# Patient Record
Sex: Female | Born: 1970 | Race: Black or African American | Hispanic: No | Marital: Married
Health system: Southern US, Community
[De-identification: ages and names within clinical notes are randomized; demographics above are authoritative.]

---

## 2010-10-20 ENCOUNTER — Emergency Department: Payer: Self-pay | Admitting: Emergency Medicine

## 2011-07-23 ENCOUNTER — Ambulatory Visit: Payer: Self-pay | Admitting: Internal Medicine

## 2013-02-13 ENCOUNTER — Inpatient Hospital Stay: Payer: Self-pay | Admitting: Internal Medicine

## 2013-02-13 LAB — COMPREHENSIVE METABOLIC PANEL
Albumin: 3.3 g/dL — ABNORMAL LOW (ref 3.4–5.0)
Alkaline Phosphatase: 54 U/L
Bilirubin,Total: 0.7 mg/dL (ref 0.2–1.0)
Calcium, Total: 8.1 mg/dL — ABNORMAL LOW (ref 8.5–10.1)
Co2: 24 mmol/L (ref 21–32)
Creatinine: 0.8 mg/dL (ref 0.60–1.30)
EGFR (African American): >60
EGFR (Non-African Amer.): >60
SGOT(AST): 19 U/L (ref 15–37)
Total Protein: 7.2 g/dL (ref 6.4–8.2)

## 2013-02-13 LAB — URINALYSIS, COMPLETE
Bacteria: NONE SEEN
Bilirubin,UR: NEGATIVE
Glucose,UR: NEGATIVE mg/dL (ref 0–75)
Ketone: NEGATIVE
Leukocyte Esterase: NEGATIVE
Ph: 5 (ref 4.5–8.0)
RBC,UR: 1 /HPF (ref 0–5)
WBC UR: 4 /HPF (ref 0–5)

## 2013-02-13 LAB — LIPASE, BLOOD: Lipase: 57 U/L — ABNORMAL LOW (ref 73–393)

## 2013-02-13 LAB — CBC
MCV: 85 fL (ref 80–100)
Platelet: 235 10*3/uL (ref 150–440)
RBC: 4.56 10*6/uL (ref 3.80–5.20)
RDW: 13.5 % (ref 11.5–14.5)

## 2013-02-13 LAB — PREGNANCY, URINE: Pregnancy Test, Urine: NEGATIVE m[IU]/mL

## 2013-02-14 LAB — CBC WITH DIFFERENTIAL/PLATELET
Basophil #: 0 10*3/uL (ref 0.0–0.1)
Basophil %: 0.1 %
Eosinophil #: 0 10*3/uL (ref 0.0–0.7)
Eosinophil %: 0 %
HCT: 36.7 % (ref 35.0–47.0)
HGB: 12.4 g/dL (ref 12.0–16.0)
Lymphocyte #: 0.7 10*3/uL — ABNORMAL LOW (ref 1.0–3.6)
Lymphocyte %: 6.7 %
MCH: 28.5 pg (ref 26.0–34.0)
Monocyte %: 1.6 %
Neutrophil #: 9.9 10*3/uL — ABNORMAL HIGH (ref 1.4–6.5)
Platelet: 213 10*3/uL (ref 150–440)
RBC: 4.35 10*6/uL (ref 3.80–5.20)
RDW: 13.3 % (ref 11.5–14.5)
WBC: 10.8 10*3/uL (ref 3.6–11.0)

## 2013-02-14 LAB — BASIC METABOLIC PANEL
BUN: 7 mg/dL (ref 7–18)
Calcium, Total: 8 mg/dL — ABNORMAL LOW (ref 8.5–10.1)
Chloride: 106 mmol/L (ref 98–107)
Creatinine: 0.64 mg/dL (ref 0.60–1.30)
EGFR (Non-African Amer.): >60
Glucose: 118 mg/dL — ABNORMAL HIGH (ref 65–99)
Potassium: 4 mmol/L (ref 3.5–5.1)

## 2013-02-16 LAB — STOOL CULTURE

## 2013-05-03 ENCOUNTER — Emergency Department: Payer: Self-pay | Admitting: Emergency Medicine

## 2014-05-21 ENCOUNTER — Emergency Department: Payer: Self-pay | Admitting: Emergency Medicine

## 2014-07-05 NOTE — Consult Note (Signed)
PATIENT NAME:  Stacy Oneal, [name] MR#:  [id number] DATE OF BIRTH:  May 26, 1970  DATE OF CONSULTATION:  02/13/2013  REFERRING PHYSICIAN:   CONSULTING PHYSICIAN:  Adah Salvageichard E. Excell Seltzerooper, MD  CHIEF COMPLAINT: Epigastric pain.   HISTORY OF PRESENT ILLNESS: This is a patient with a history of fall, which was preceded by back pain and abdominal pain that started on Thursday or Friday. It has been gradually worsening. She cannot point to any area in particular except the epigastrium, but states that it is all over her abdomen. She has never had an episode like this before. She has had fevers as high as 102 at home. She has had some diarrhea and vomited multiple times. She does not localize her pain to the right side at all.   PAST MEDICAL HISTORY: Adrenal insufficiency and multiple sclerosis.   PAST SURGICAL HISTORY: Tonsillectomy, adenoidectomy and hemorrhoidectomy.   ALLERGIES: None.   MEDICATIONS: Multiple, see chart.   FAMILY HISTORY: Noncontributory.   SOCIAL HISTORY: The patient does not smoke or drink.   REVIEW OF SYSTEMS: 10 system review is performed and negative with the exception of that mentioned in the HPI.   PHYSICAL EXAMINATION:  GENERAL: Healthy female patient, comfortable-appearing.  VITAL SIGNS: Temperature of 98.2, pulse 99, respirations 18, blood pressure 103/71, 99% room air sat.  HEENT: No scleral icterus.  NECK: No palpable neck nodes.  CHEST: Clear to auscultation.  CARDIAC: Regular rate and rhythm.  ABDOMEN: Soft, nondistended, non-tympanitic, minimally diffuse tenderness more so in the upper quadrants than the lower quadrants. No guarding, no rebound and no percussion tenderness.  EXTREMITIES: Without edema. Calves are nontender.  NEUROLOGIC: Grossly intact.  INTEGUMENT: No jaundice.   CT scan is personally reviewed. The appendix is identified and dilated, but there is no periappendiceal stranding or fluid. There is some fluid in the pelvis. Electrolytes  demonstrate a low potassium of 3.1, low calcium at 8.1, otherwise normal and a lipase of 57. White blood cell count is 9.0, hemoglobin and hematocrit of 12 and 39, platelet count 235. HCG is negative.   ASSESSMENT AND PLAN: This is a patient with epigastric pain, diffuse abdominal pain and tenderness, which is very minimal in nature, without peritoneal signs and I was asked to see the patient because the CT scan showed a dilated appendix. There is no evidence of appendicitis in this patient, With her nausea, vomiting, diarrhea and epigastric pain, I would suggest obtaining a ultrasound to rule out gallstones as this is certainly a possibility. It is not well identified on the CT scan, I will order the ultrasound and follow the patient while she is in the hospital,  ____________________________ Adah Salvageichard E. Excell Seltzerooper, MD rec:aw D: 02/13/2013 15:44:37 ET T: 02/13/2013 15:52:12 ET JOB#: [id number]  cc: Adah Salvageichard E. Excell Seltzerooper, MD, <Dictator> Lattie HawICHARD E Elanna Bert MD ELECTRONICALLY SIGNED 02/14/2013 18:07

## 2014-07-05 NOTE — H&P (Signed)
PATIENT NAME:  Stacy Oneal, Stacy D MR#:  [id number] DATE OF BIRTH:  1970-05-09  DATE OF ADMISSION:  02/13/2013  PRIMARY CARE PROVIDER: None local.   EMERGENCY DEPARTMENT REFERRING PHYSICIAN: Dr. Burman RiisWoodrow.   CHIEF COMPLAINT: Nausea, vomiting, diarrhea, abdominal pain.   HISTORY OF PRESENT ILLNESS: The patient is a 44 year old African American female with history of adrenal insufficiency, multiple sclerosis, who has chronic weakness in her leg but is still able to ambulate. On Thanksgiving, the patient was walking outside when she almost fell and caught herself on her leg, but at that time she twisted her back, and since then she is having significant amount of constant, sharp back pain with pain going down her legs with some weakness in the legs. Along with that, she started having nausea, vomiting and diarrhea and has felt very weak. She also has felt feverish at home, therefore, came to the ED. She was noted to have a low blood pressure, 99/56, and her heart rate was a little elevated. The patient, otherwise, also complains of abdominal pain in the epigastric area, but has not had any hematemesis or hematochezia. Denies any urinary frequency, urgency or hesitancy. Reports that she has been taking her medications as prescribed. For her adrenal insufficiency, her primary care provider did call in atropine and diphenoxylate for diarrhea, as well as Zofran and Percocet.   PAST MEDICAL HISTORY: Significant for adrenal insufficiency, multiple sclerosis for 10 years.   ALLERGIES: None.   CURRENT MEDICATIONS: Atropine, diphenoxylate 1 tab 4 times a day as needed for diarrhea, hydrocortisone 10 mg at bedtime, hydrocortisone 5 mg 3 tabs daily, Zofran 4 mg 1 tab p.o. t.i.d. as needed for nausea, vomiting; Percocet 5/325 mg 1 tab p.o. q.6 p.r.n.   SOCIAL HISTORY: Does not smoke. Does not drink. No drugs.   FAMILY HISTORY: Father with coronary artery disease, diabetes.     REVIEW OF SYSTEMS:    CONSTITUTIONAL: Complains of fevers, fatigue, weakness, back pain. No weight loss. No weight gain.  EYES: No blurred or double vision. No pain. No redness. No inflammation. No glaucoma. No cataracts.  EARS, NOSE, THROAT: No tinnitus. No ear pain. No hearing loss. No seasonal or year-round allergies. No epistaxis. No postnasal drip. No difficulty swallowing.  RESPIRATORY: Denies any cough, wheezing, hemoptysis. No COPD.  No TB.  CARDIOVASCULAR: Denies any chest pain, orthopnea, edema or arrhythmia.  GASTROINTESTINAL: Complains of nausea, vomiting, diarrhea. Complains of abdominal pain. No hematemesis. No melena. No guarding. No IBS. No jaundice. No rectal bleeding. Complains of diarrhea.    GENITOURINARY: Denies any dysuria, hematuria, renal calc or frequency.  ENDOCRINE: Denies any polyuria, nocturia or thyroid problems.  HEMATOLOGIC AND LYMPHATIC: Denies anemia, easy bruisability or bleeding.  SKIN: No acne. No rash. No changes in hair or moles.  MUSCULOSKELETAL: Complains of significant back pain.  NEUROLOGIC: Denies any numbness. Complains of weakness in both lower extremities. Complains of difficulty with ambulation due to pain. No CVA. No TIA.  Has multiple sclerosis. No seizures.  PSYCHIATRIC: No anxiety. No insomnia. No ADD.   PHYSICAL EXAMINATION: VITAL SIGNS: Temperature 99.7, pulse 100, respirations 18, blood pressure 99/56.  GENERAL: The patient is a well-developed [place] female in no acute distress.  HEAD, EYES, EARS, NOSE, THROAT: Head atraumatic, normocephalic. Pupils equally round and reactive to light and accommodation. There is no conjunctival pallor. No scleral icterus. Nasal exam shows no drainage or ulceration. Oropharynx is clear without any exudate.  NECK: Supple without any JVD.  CARDIOVASCULAR: Regular rate and  rhythm. No murmurs, rubs, clicks or gallops. PMI is not displaced.  ABDOMEN: Soft, nondistended. Positive bowel sounds x 4. She does have some mild  epigastric tenderness without any guarding. No rebound.  EXTREMITIES: No clubbing, cyanosis or edema.  SKIN: No rash.  LYMPHATICS: No lymph nodes palpable.  VASCULAR: Good DP, PT pulses.  MUSCULOSKELETAL: Has lumbar spine tenderness.  NEUROLOGICAL: She has bilateral lower extremity weakness. Awake, alert, oriented x 3. No focal deficits.  PSYCHIATRIC: Not anxious or depressed.   LABORATORY EVALUATION:  CT of the abdomen shows appendix is fluid-filled and mildly distended measuring 8.1 mm without periappendiceal inflammatory changes. Her glucose is 79. BUN 10, creatinine 0.80, sodium 137, potassium 3.1, chloride 106. CO2 is 24. Lipase 57. LFTs showed albumin of 3.3. WBC 9.0, hemoglobin 12.6, platelet count 253. Urinalysis is negative.   ASSESSMENT AND PLAN: The patient is a 44 year old African American female with a history of renal insufficiency, multiple sclerosis, has been having significant back pain, difficulty with ambulation.  1.  Nausea, vomiting, diarrhea, possibly due to adrenal crisis. At this time, we will treat her with IV dexamethasone. Her appendix is dilated, but there is no inflammation. However, in light of her abdominal pain, I will ask surgery to evaluate. We will provide her with pain control. Also, in light of diarrhea, we will send stools for C. diff and stool cultures. If persist, will need GI evaluation.  2.  Back pain after a near fall. CT scan shows broad-based disk bulging. At this time, we will get an MRI of the spine. Pain control for the time being.  3.  Low-grade temperature. We will get blood cultures. Urine is negative. We will treat her with empiric antibiotics with Levaquin in light of her adrenal insufficiency.  4.  Miscellaneous: I will place her on Lovenox for DVT prophylaxis.    NOTE: 55 minutes spent on this patient.      ____________________________ Lacie Scotts. Allena Katz, MD shp:dmm D: 02/13/2013 13:20:37 ET T: 02/13/2013 13:32:46  ET JOB#: [id number]  cc: Gitty Osterlund H. Allena Katz, MD, <Dictator> Charise Carwin MD ELECTRONICALLY SIGNED 02/23/2013 12:45

## 2014-07-05 NOTE — Consult Note (Signed)
Brief Consult Note: Diagnosis: epigastric pain.   Patient was seen by consultant.   Consult note dictated.   Recommend further assessment or treatment.   Orders entered.   Comments: Four days epig pain, diffuse abd [pain, no localization to RLQ. No peritoneal signs, nonlocalized. Nml WBC. CT personally rev'd.No sign of appendicitis. Will follow.  Electronic Signatures: Lattie Hawooper, Mendel Binsfeld E (MD)  (Signed 02-Dec-14 15:38)  Authored: Brief Consult Note   Last Updated: 02-Dec-14 15:38 by Lattie Hawooper, Layloni Fahrner E (MD)

## 2014-07-05 NOTE — Discharge Summary (Signed)
PATIENT NAME:  Stacy Oneal, Stacy Oneal MR#:  [id number] DATE OF BIRTH:  Jul 19, 1970  DATE OF ADMISSION:  02/13/2013  DATE OF DISCHARGE: 02/15/2013  DISCHARGE DIAGNOSES:  1.  Acute gastroenteritis. 2.  Adrenal insufficiency. 3.  Fall and back pain, muscle spasms.   CONDITION ON DISCHARGE: Stable.   CODE STATUS: FULL CODE.   MEDICATIONS ON DISCHARGE: 1.  Hydrocortisone 5 mg oral tablet 3 tablets once a day.  2.  Hydrocortisone 10 mg oral tablet 1 tablet once a day at bedtime.  3.  Ondansetron 4 mg oral tablet 3 times a day as needed for nausea.  4.  Atropine and diphenoxylate 1 tablet 4 times a day as needed for diarrhea.  5.  Percocet 1 tablet every 6 hours as needed for pain.  6.  Cyclobenzaprine 10 mg oral tablet every 8 hours as needed for muscle spasms.   INSTRUCTIONS:  Diet on discharge: Regular. Diet consistency: Regular. Time frame to follow up:  Within 2 to 4 weeks. Advised to have routine follow ups with primary care physician.   HISTORY OF PRESENTING ILLNESS: A 44 year old African-American female with history of adrenal insufficiency, multiple sclerosis, with chronic weakness in her leg, but is still able to ambulate. On Thanksgiving, the patient was walking outside when she almost fell down and caught herself on her leg, but at that time she twisted her back. Since then, she is having significant amount of constant, sharp back pain, with pain going down her legs, with some weakness in the legs. Along with that, she started having nausea, vomiting and diarrhea, and has felt very weak. She also has felt feverish at home; therefore, came to Emergency Room. She was noted to have a low blood pressure of 99/56, and her heart rate was a little elevated. The patient otherwise also complains of abdominal pain in epigastric area, but did not have any blood in the stool or vomit, so she was admitted for further management.   HOSPITAL COURSE AND STAY:    1.  For nausea, vomiting and diarrhea,  most likely it was gastroenteritis. All the workups were negative, including stool studies. She was on empiric antibiotics, and started feeling better with symptomatic management, so we discharged her home.   2.  Back pain after a fall. CT scan was done, which showed broad-spaced disk bulging. MRI does not show any acute finding. Muscle relaxants and pain control were ordered. Consult was done with Orthopedic, and did not suggest anything further.  3.  Low-grade temperature. Blood cultures were negative. Urine and x-ray was negative, so possibly it was due to gastroenteritis.   4.  Adrenal insufficiency. This was a chronic and stable issue. The patient was on chronic steroid replacement therapy. We continued the same in the hospital.   IMPORTANT LAB RESULTS IN THE HOSPITAL: Urinalysis was negative. White cell count was 9000 on presentation, hemoglobin 12.6, lipase 57, creatinine 0.8. Pregnancy test in the urine was negative. CT abdomen and pelvis with contrast showed appendix is fluid-filled and mildly distended, 8.1 mm. Surgery consult was done for this, and they suggested no surgical management at this time. MRI lumbar spine did not show any acute finding. C. diff in the stool was negative. Stool culture did not grow any organisms. Ultrasound abdomen, general survey:  No evidence of gallstone or acute cholecystitis. Liver, spleen, visualized portion of, no acute abnormality in kidney, no ascites.    Total time spent on this discharge: 40 minutes.    ____________________________ Hope Pigeon  Elisabeth PigeonVachhani, MD vgv:mr D: 02/17/2013 16:12:00 ET T: 02/17/2013 20:35:41 ET JOB#: [id number]  cc: Hope PigeonVaibhavkumar G. Elisabeth PigeonVachhani, MD, <Dictator> Altamese DillingVAIBHAVKUMAR Dorette Hartel MD ELECTRONICALLY SIGNED 02/20/2013 9:19

## 2014-07-21 IMAGING — US ABDOMEN ULTRASOUND
1 series · 13 of 25 positions shown · non-contrast
Comparison: None.

CLINICAL DATA: Epigastric pain and symptoms suspicious of
cholecystitis

EXAM:
ULTRASOUND ABDOMEN COMPLETE

[Series 1: abdomen ultrasound · 0.17mm/px · 13 of 66 slices shown]
[im 1/66]
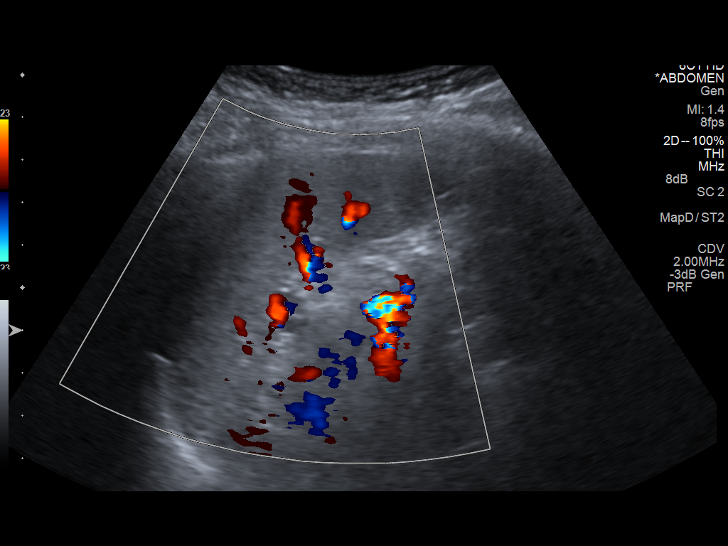
[im 6/66]
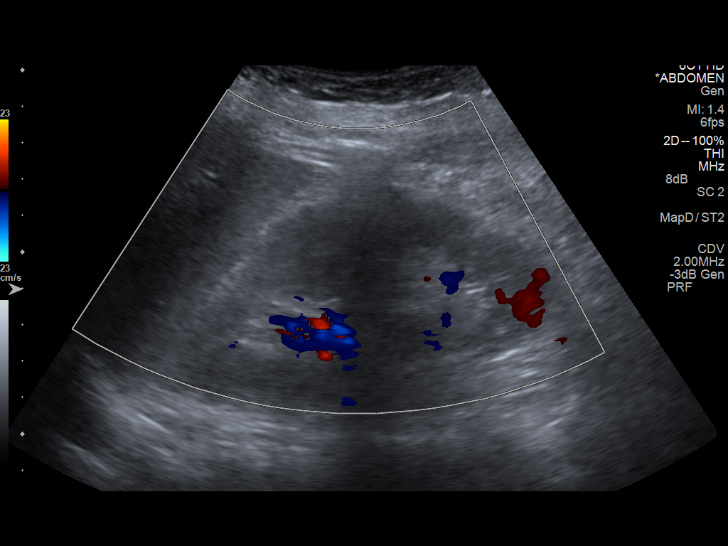
[im 11/66]
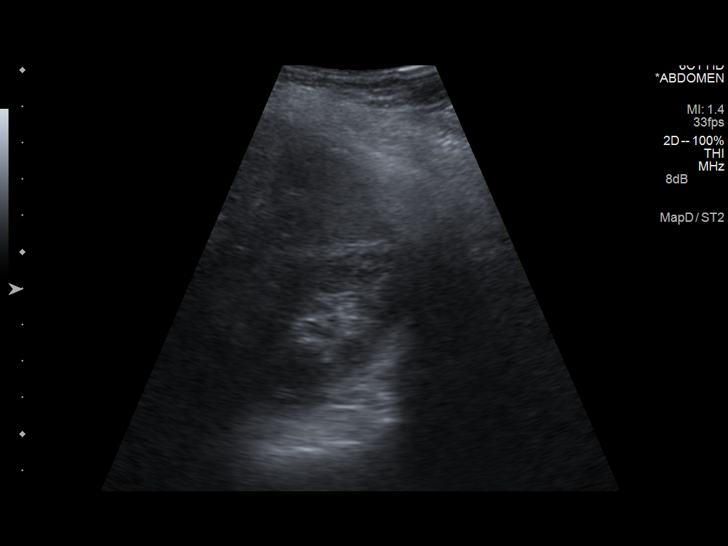
[im 17/66]
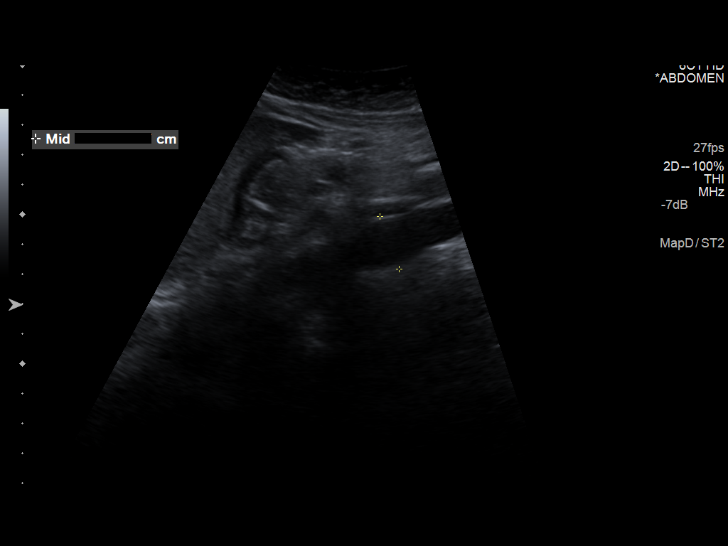
[im 22/66]
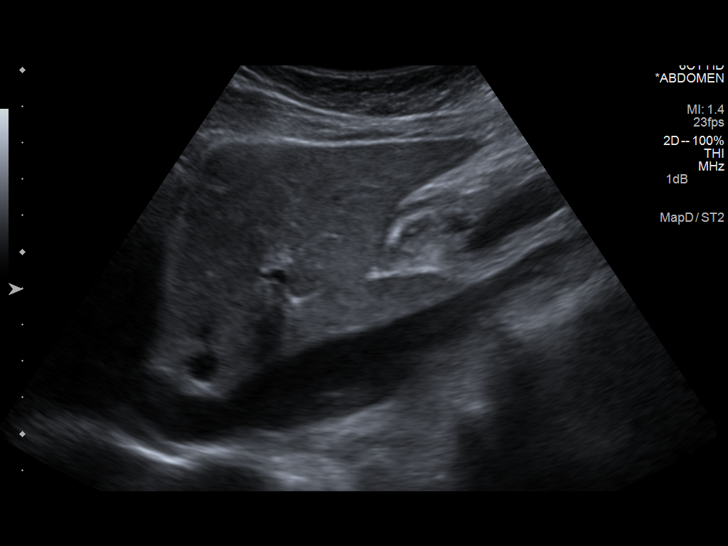
[im 28/66]
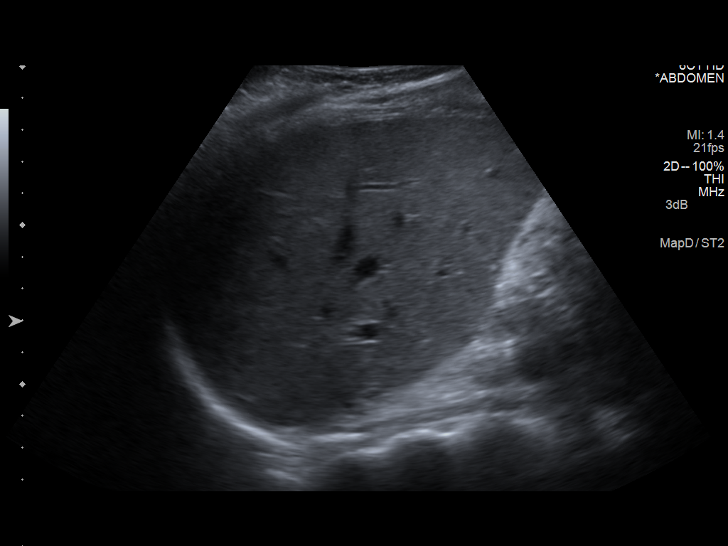
[im 33/66]
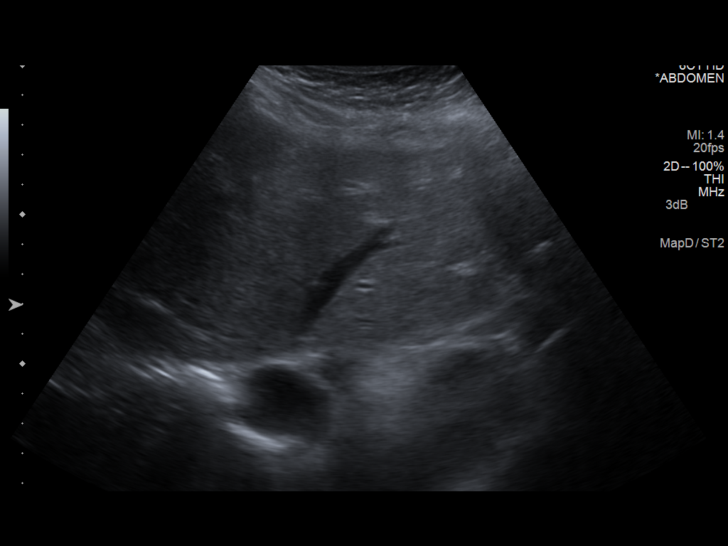
[im 38/66]
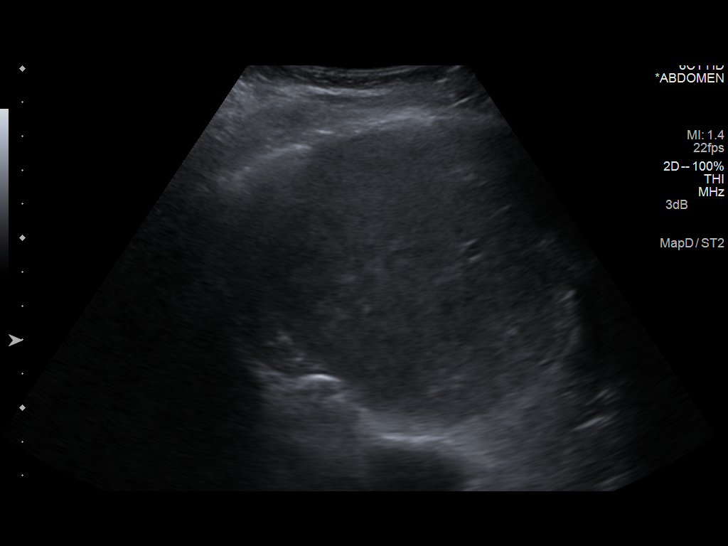
[im 44/66]
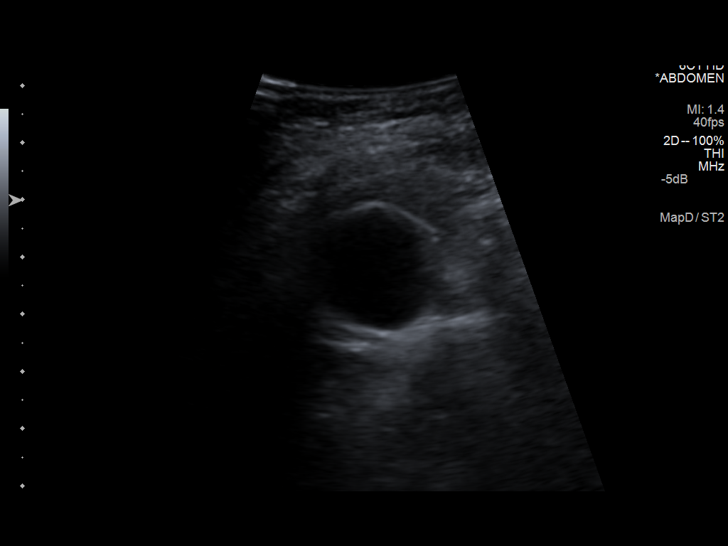
[im 49/66]
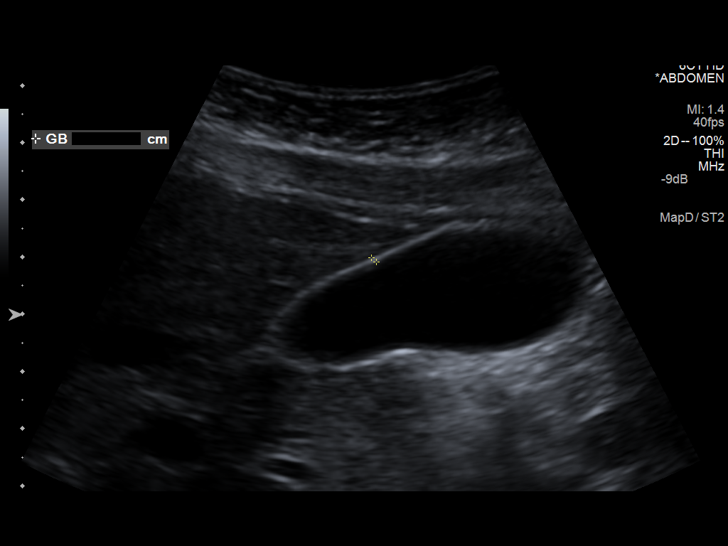
[im 55/66]
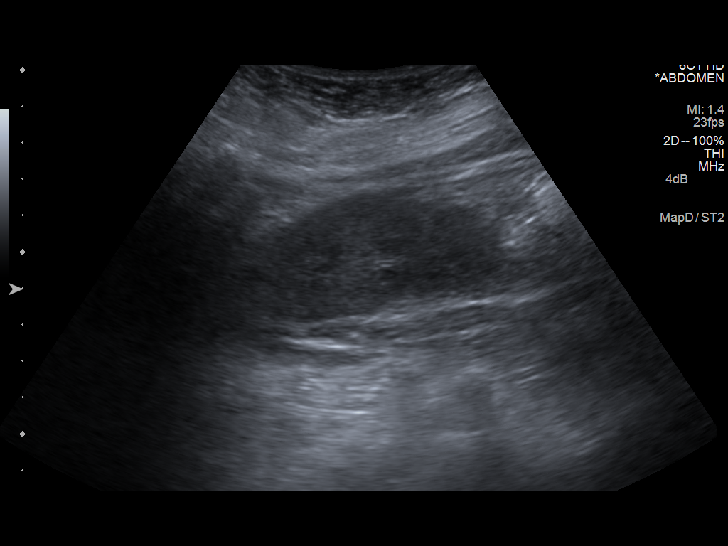
[im 60/66]
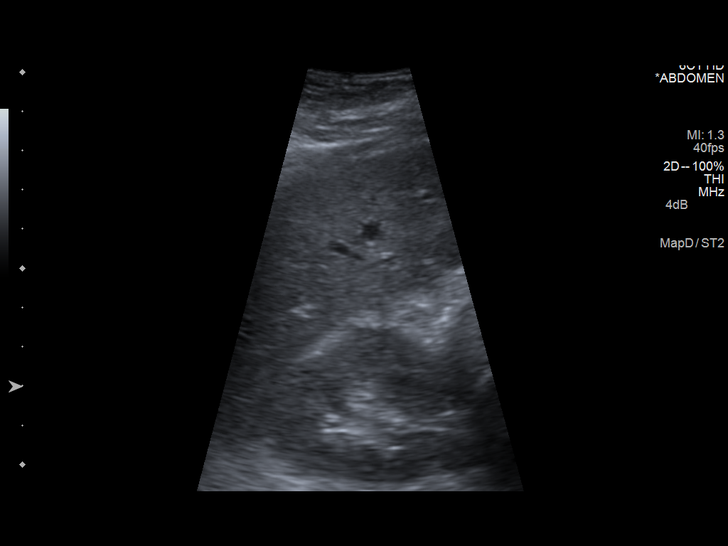
[im 66/66]
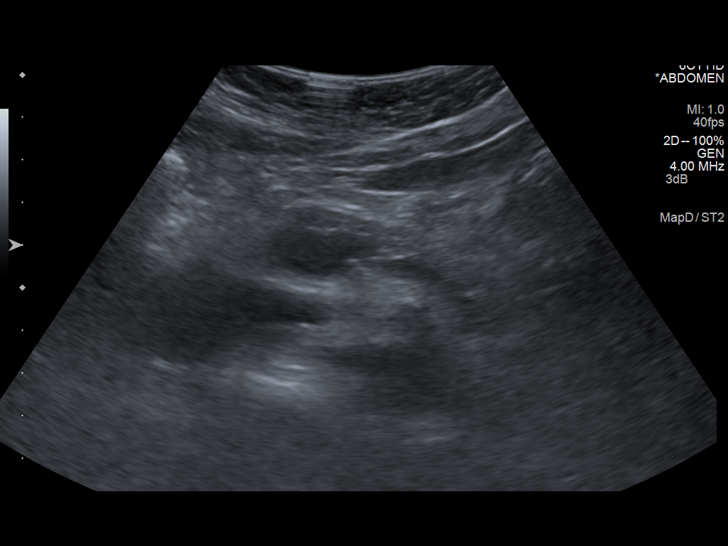

[13 of 25 positions shown; findings below may reference images not displayed]

FINDINGS: Gallbladder:

The gallbladder is adequately distended with no evidence of stones,
wall thickening, or pericholecystic fluid. The gallbladder wall
measures 1.1 mm. There is no positive sonographic Murphy's sign.

Common bile duct:

Diameter: 3.6 mm.

Liver:

No focal lesion identified. Within normal limits in parenchymal
echogenicity.

IVC:

No abnormality visualized.

Pancreas:

Evaluation of the pancreas was limited due to due to bowel gas but
no definite lesion is demonstrated.

Spleen:

Size and appearance within normal limits.

Right Kidney:

Length: 12.4 cm. Echogenicity within normal limits. No mass or
hydronephrosis visualized.

Left Kidney:

Length: 11.4 cm. Echogenicity within normal limits. No mass or
hydronephrosis visualized.

Abdominal aorta:

The maximal measured diameter of the abdominal aorta is 2.5 cm.
Proximally. A normal tapering caliber is demonstrated. New

Other findings:

No ascites is demonstrated.
IMPRESSION: 1. There is no evidence of gallstones nor acute cholecystitis. The
common bile duct is also normal in appearance.
2. The liver, spleen, and visualized portions of the pancreas appear
normal.
3. There is no acute abnormality of the kidneys.
4. No ascites is demonstrated.

## 2014-10-07 IMAGING — CT CT HEAD WITHOUT CONTRAST
1 series · 16 of 30 positions shown, 20 images · non-contrast
Comparison: None.

CLINICAL DATA: Left-sided headache.  Migraines.

EXAM:
CT HEAD WITHOUT CONTRAST
TECHNIQUE: Contiguous axial images were obtained from the base of the skull
through the vertex without intravenous contrast.

[Series 2: soft tissue · axial · 0.42mm/px · z∈[-88,+47]mm · 16 of 30 slices shown, 20 images]
[im 2/30  brain]
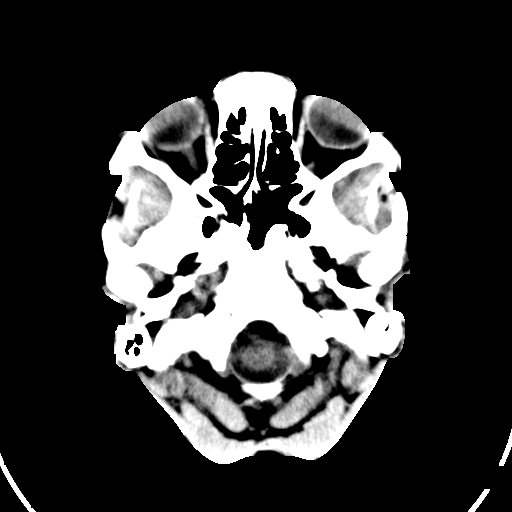
[im 2/30  bone]
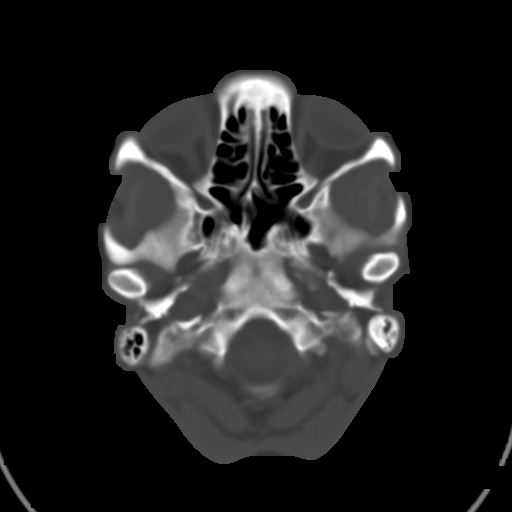
[im 4/30  brain]
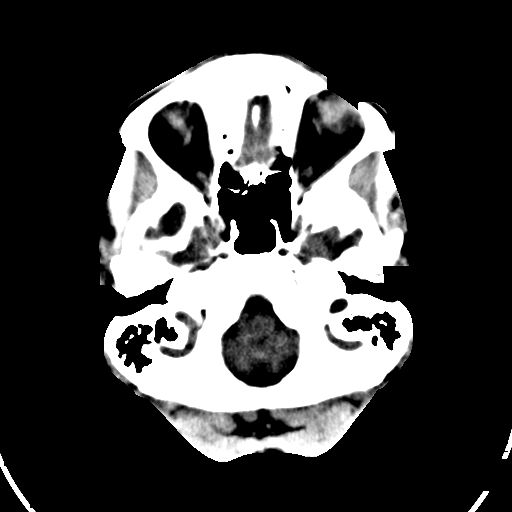
[im 6/30  brain]
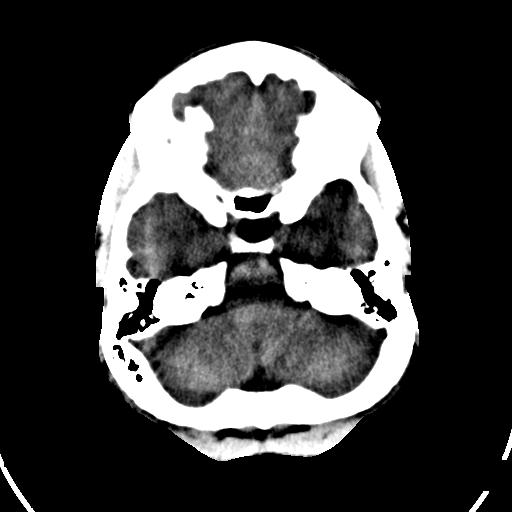
[im 8/30  brain]
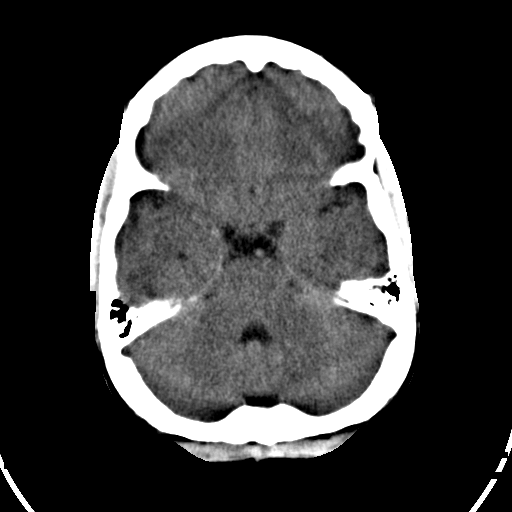
[im 9/30  brain]
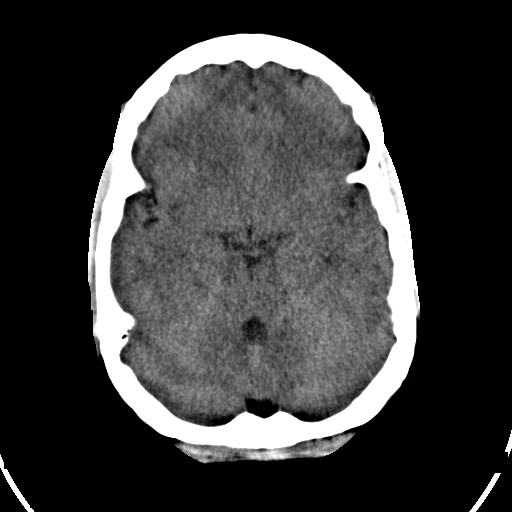
[im 9/30  bone]
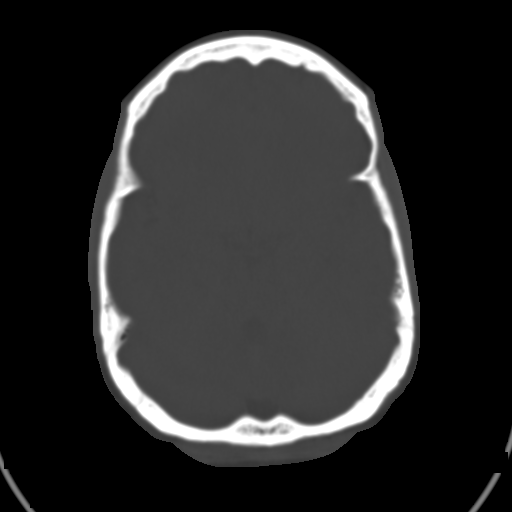
[im 11/30  brain]
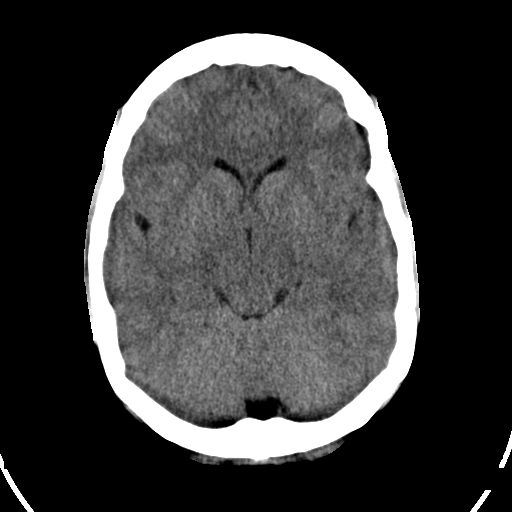
[im 13/30  brain]
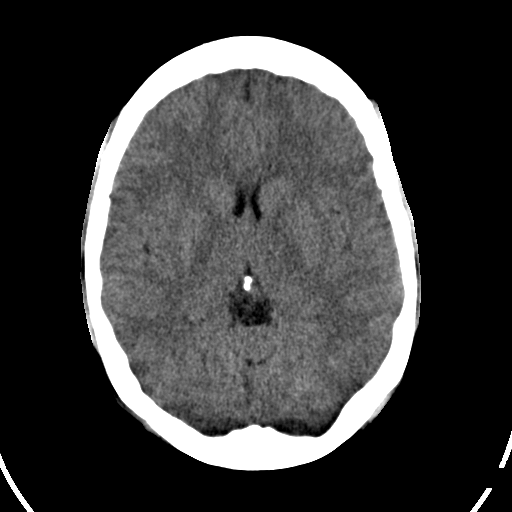
[im 15/30  brain]
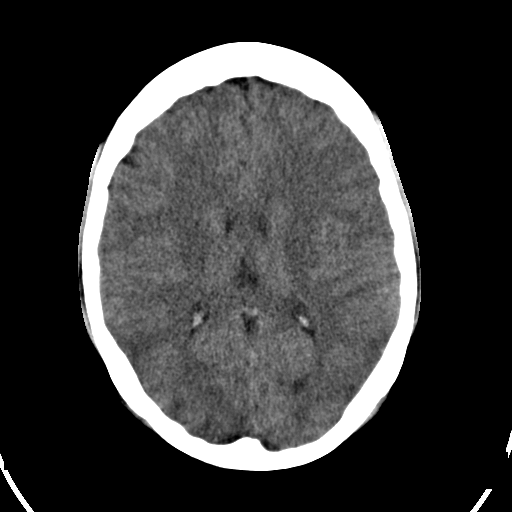
[im 16/30  brain]
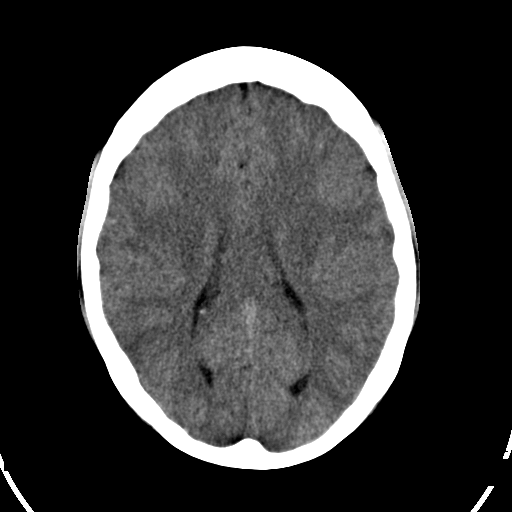
[im 16/30  bone]
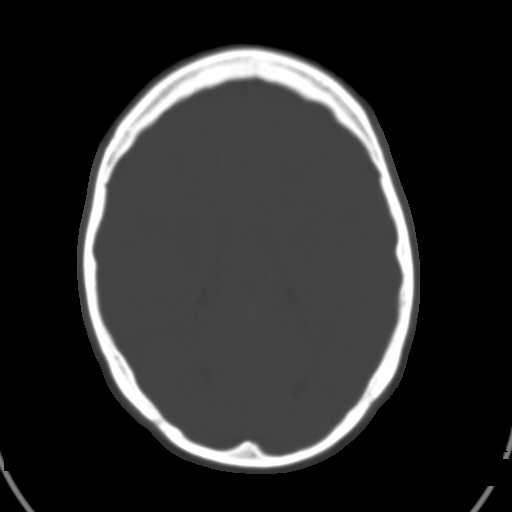
[im 18/30  brain]
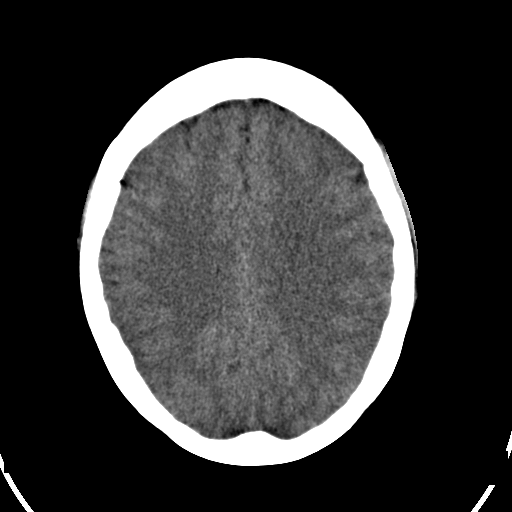
[im 20/30  brain]
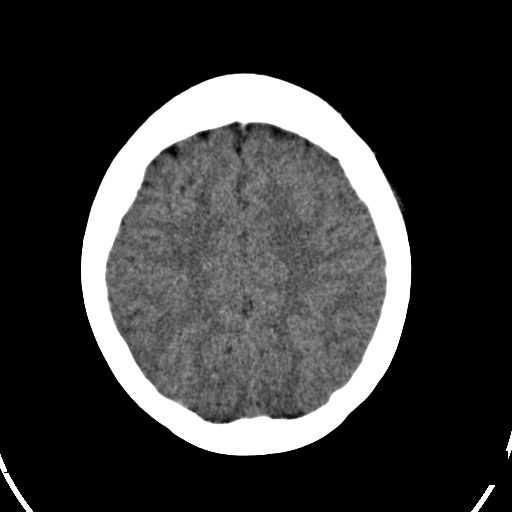
[im 22/30  brain]
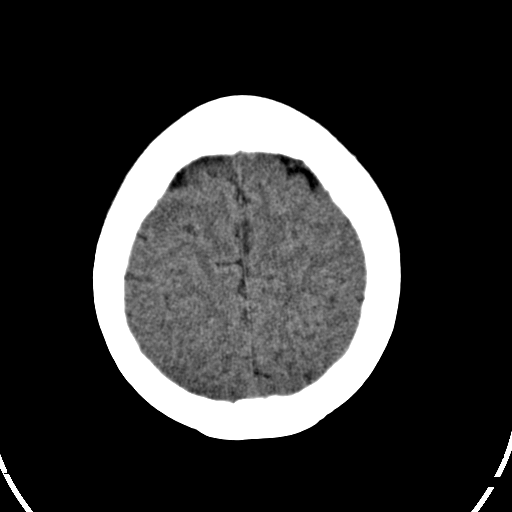
[im 23/30  brain]
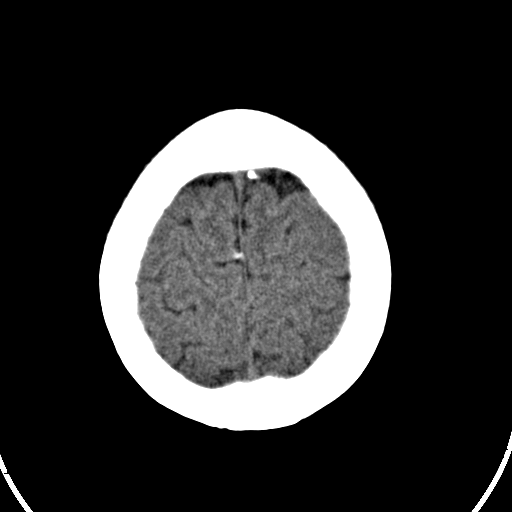
[im 23/30  bone]
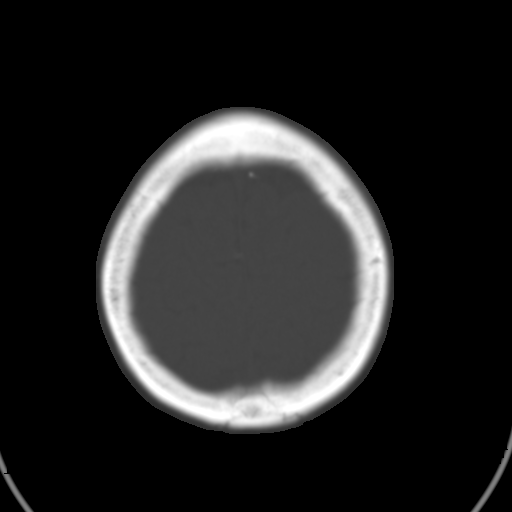
[im 25/30  brain]
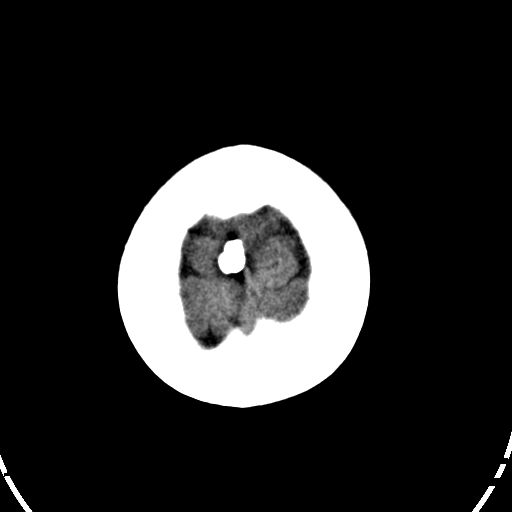
[im 27/30  brain]
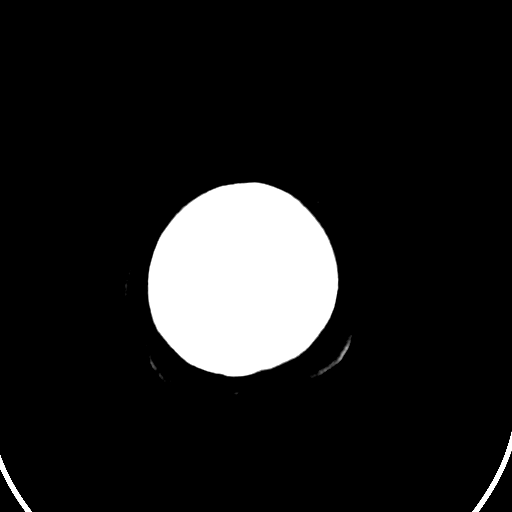
[im 29/30  brain]
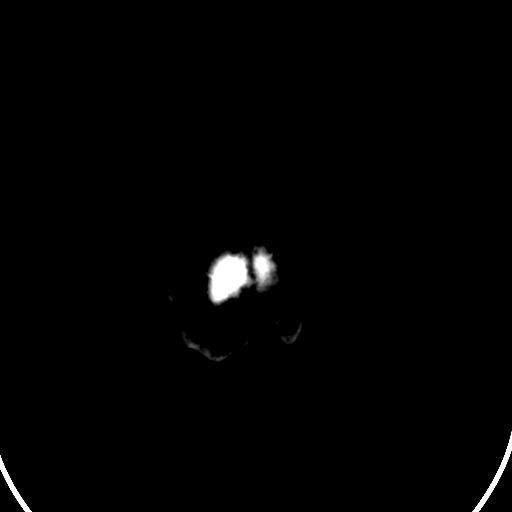

[16 of 30 positions shown; findings below may reference images not displayed]

FINDINGS: No acute intracranial abnormality. Specifically, no hemorrhage,
hydrocephalus, mass lesion, acute infarction, or significant
intracranial injury. No acute calvarial abnormality. Visualized
paranasal sinuses and mastoids clear. Orbital soft tissues
unremarkable.
IMPRESSION: No acute intracranial abnormality.
